# Patient Record
Sex: Male | Born: 1980 | Hispanic: No | Marital: Married | Smoking: Never smoker
Health system: Southern US, Community
[De-identification: ages and names within clinical notes are randomized; demographics above are authoritative.]

## PROBLEM LIST (undated history)

## (undated) DIAGNOSIS — E119 Type 2 diabetes mellitus without complications: Secondary | ICD-10-CM

## (undated) HISTORY — PX: TONSILLECTOMY: SUR1361

---
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
## 2015-09-06 DIAGNOSIS — H52223 Regular astigmatism, bilateral: Secondary | ICD-10-CM | POA: Diagnosis not present

## 2015-09-06 DIAGNOSIS — H5213 Myopia, bilateral: Secondary | ICD-10-CM | POA: Diagnosis not present

## 2015-09-07 DIAGNOSIS — Z79899 Other long term (current) drug therapy: Secondary | ICD-10-CM | POA: Diagnosis not present

## 2015-09-07 DIAGNOSIS — R7301 Impaired fasting glucose: Secondary | ICD-10-CM | POA: Diagnosis not present

## 2015-09-07 MED FILL — METFORMIN HCL ER 500 MG TAB: 500 | 90 days supply | Qty: 180 | Fill #0

## 2015-09-09 DIAGNOSIS — F909 Attention-deficit hyperactivity disorder, unspecified type: Secondary | ICD-10-CM | POA: Diagnosis not present

## 2015-09-10 MED FILL — MODAFINIL 200 MG TABLET: 200 | 90 days supply | Qty: 90 | Fill #0

## 2015-09-17 DIAGNOSIS — E042 Nontoxic multinodular goiter: Secondary | ICD-10-CM | POA: Diagnosis not present

## 2015-09-17 DIAGNOSIS — E8881 Metabolic syndrome: Secondary | ICD-10-CM | POA: Diagnosis not present

## 2015-09-17 DIAGNOSIS — E291 Testicular hypofunction: Secondary | ICD-10-CM | POA: Diagnosis not present

## 2015-10-04 ENCOUNTER — Encounter: Payer: Self-pay | Admitting: Emergency Medicine

## 2015-10-04 ENCOUNTER — Ambulatory Visit
Admission: EM | Admit: 2015-10-04 | Discharge: 2015-10-04 | Disposition: A | Discharge status: Home / Self Care | Payer: 59 | Attending: Family Medicine | Admitting: Family Medicine

## 2015-10-04 DIAGNOSIS — S90852A Superficial foreign body, left foot, initial encounter: Secondary | ICD-10-CM

## 2015-10-04 HISTORY — DX: Type 2 diabetes mellitus without complications: E11.9

## 2015-10-04 MED ORDER — LIDOCAINE HCL (PF) 1 % IJ SOLN: 5.0000 mL | Freq: Once | INTRAMUSCULAR | Status: DC

## 2015-10-04 MED ORDER — CEPHALEXIN 500 MG PO CAPS: 500.0000 mg | ORAL_CAPSULE | Freq: Four times a day (QID) | ORAL | Status: AC

## 2015-10-04 NOTE — ED Notes (Signed)
Patient states that he has a splinter in his left foot since yesterday.  Patient reports some redness and tenderness.

## 2015-10-04 NOTE — ED Provider Notes (Signed)
[facility]  ____________________________________________  Time seen: Approximately 11:48 AM  I have reviewed the triage vital signs and the nursing notes.   HISTORY  Chief Complaint Foreign Body in Skin   HPI Russell Willis is a 35 y.o. male presents with a complaint of splinter and left foot since yesterday. Patient reports that he was walking outside barefoot on his patio and felt something go into his foot. Patient states that he did pull out a small piece of what appeared to be wood, but states he is concern that a another piece is still in his foot. Denies any other pain or injury. Denies head injury or loss of consciousness. Denies fall to the ground. Patient states there is minimal redness directly around puncture site. Denies any drainage. Denies any fevers. Denies any numbness, tingling sensation or pain radiation. Patient reports his tetanus immunization is up-to-date and was within the last 2 years.  Denies any other pain or complaints.  PCP: [facility].    Past Medical History  Diagnosis Date  . Diabetes mellitus without complication (HCC)     There are no active problems to display for this patient.   Past Surgical History  Procedure Laterality Date  . Tonsillectomy      Current Outpatient Rx  Name  Route  Sig  Dispense  Refill  . clomiPHENE (CLOMID) 50 MG tablet   Oral   Take 50 mg by mouth as directed.         . metFORMIN (GLUCOPHAGE) 500 MG tablet   Oral   Take 250 mg by mouth 2 (two) times daily with a meal.         . modafinil (PROVIGIL) 200 MG tablet   Oral   Take 200 mg by mouth daily.         .             Allergies Amoxicillin  History reviewed. No pertinent family history.  Social History Social History  Substance Use Topics  . Smoking status: Never Smoker   . Smokeless tobacco: Never Used  . Alcohol Use: Yes    Review of Systems Constitutional: No fever/chills Eyes: No visual changes. ENT:  No sore throat. Cardiovascular: Denies chest pain. Respiratory: Denies shortness of breath. Gastrointestinal: No abdominal pain.  No nausea, no vomiting.  No diarrhea.  No constipation. Genitourinary: Negative for dysuria. Musculoskeletal: Negative for back pain. Skin: Negative for rash. Neurological: Negative for headaches, focal weakness or numbness.  10-point ROS otherwise negative.  ____________________________________________   PHYSICAL EXAM:  VITAL SIGNS: ED Triage Vitals  Enc Vitals Group     BP 10/04/15 1046 119/75 mmHg     Pulse Rate 10/04/15 1046 80     Resp 10/04/15 1046 16     Temp 10/04/15 1046 98.2 F (36.8 C)     Temp Source 10/04/15 1046 Tympanic     SpO2 10/04/15 1046 98 %     Weight 10/04/15 1046 250 lb (113.399 kg)     Height 10/04/15 1046  (1.727 m)     Head Cir --      Peak Flow --      Pain Score 10/04/15 1051 2     Pain Loc --      Pain Edu? --      Excl. in GC? --     Constitutional: Alert and oriented. Well appearing and in no acute distress. Eyes: Conjunctivae are normal. PERRL. EOMI. Head: Atraumatic.   Nose: No  congestion/rhinnorhea.  Mouth/Throat: Mucous membranes are moist.   Hematological/Lymphatic/Immunilogical: No cervical lymphadenopathy. Cardiovascular: Normal rate, regular rhythm. Grossly normal heart sounds.  Good peripheral circulation. Respiratory: Normal respiratory effort.  No retractions. Lungs CTAB. Musculoskeletal: No lower or upper extremity tenderness nor edema. Bilateral pedal pulses equal and easily palpated. See skin below.  Neurologic:  Normal speech and language. No gross focal neurologic deficits are appreciated. No gait instability. Skin:  Skin is warm, dry and intact. No rash noted. Except : Left distal medial plantar foot small appearance of superficial foreign body present, minimal erythema directly around puncture site, minimal tenderness, no exudate, no drainage, full range of motion, no bony tenderness,  left foot otherwise nontender. Left foot full range of motion. Capillary refill less than 2 seconds to all left foot distal toes.  Psychiatric: Mood and affect are normal. Speech and behavior are normal.  ____________________________________________   LABS (all labs ordered are listed, but only abnormal results are displayed)  Labs Reviewed - No data to display  RADIOLOGY  Patient declines need for.  ____________________________________________   PROCEDURES  Procedure(s) performed:  Procedure(s) performed:  Procedure explained and verbal consent obtained. Consent: Verbal consent obtained. Written consent not obtained. Risks and benefits: risks, benefits and alternatives were discussed Patient identity confirmed: verbally with patient and hospital-assigned identification number  Consent given by: patient   Foreign body removal Location: Left plantar foot Foreign bodies: appearance of small dark colored foreign body Tendon involvement: none Nerve involvement: none Preparation: Patient was prepped and draped in the usual sterile fashion. Anesthesia with 1% Lidocaine 2 mls Irrigation solution: saline and betadine Irrigation method: jet lavage Amount of cleaning: copious Very superficial small incision made with #11 blade scalpel and sterile forceps used to remove small dark colored foreign body with appearance of wound grains. Foreign body <0.25 cm in length. Irrigated post removal. No remaining foreign bodies visualized.  Technique: simple interrupted  Approximation: loose Patient tolerate well.  dressing applied.  Wound care instructions provided.  Observe for any signs of infection or other problems.     ____________________________________________   INITIAL IMPRESSION / ASSESSMENT AND PLAN / ED COURSE  Pertinent labs & imaging results that were available during my care of the patient were reviewed by me and considered in my medical decision making (see chart for  details).  Left plantar surface foreign body which he states he stepped on the splinter yesterday. Patient reports he did pull part of it out. Small superficial remaining foreign body visualized. Foreign body body removed. Patient tolerated well. No appearance of retained foreign body. Patient reports he is a prediabetic and currently on metformin. Counseled regarding wound monitoring and cleaning. Encourage topical antibiotic over-the-counter. Will prescribe cephalexin for 5 days. Encourage monitoring and elevation of left foot.   Discussed follow up with Primary care physician this week. Discussed follow up and return parameters including no resolution or any worsening concerns. Patient verbalized understanding and agreed to plan.   ____________________________________________   FINAL CLINICAL IMPRESSION(S) / ED DIAGNOSES  Final diagnoses:  Splinter of left foot, initial encounter      Note: This dictation was prepared with Dragon dictation along with smaller phrase technology. Any transcriptional errors that result from this process are unintentional.    Renford DillsLindsey Elyce Zollinger, NP 10/04/15 1157

## 2015-10-04 NOTE — Discharge Instructions (Signed)
Take medication as prescribed. Keep clean with soap and water, apply topical antibiotic ointment daily as discussed.    Follow up with your primary care physician this week as needed. Return to Urgent care for new or worsening concerns.    Sliver Removal, Care After A sliver--also called a splinter--is a small and thin broken piece of an object that gets stuck (embedded) under the skin. A sliver can create a deep wound that can easily become infected. It is important to care for the wound after a sliver is removed to help prevent infection and other problems from developing. WHAT TO EXPECT AFTER THE PROCEDURE Slivers often break into smaller pieces when they are removed. If pieces of your sliver broke off and stayed in your skin, you will eventually see them working themselves out and you may feel some pain at the wound site. This is normal. HOME CARE INSTRUCTIONS  Keep all follow-up visits as directed by your health care provider. This is important.  There are many different ways to close and cover a wound, including stitches (sutures) and adhesive strips. Follow your health care provider's instructions about:  Wound care.  Bandage (dressing) changes and removal.  Wound closure removal.  Check the wound site every day for signs of infection. Watch for:  Red streaks coming from the wound.  Fever.  Redness or tenderness around the wound.  Fluid, blood, or pus coming from the wound.  A bad smell coming from the wound. SEEK MEDICAL CARE IF:  You think that a piece of the sliver is still in your skin.  Your wound was closed, as with sutures, and the edges of the wound break open.  You have signs of infection, including:  New or worsening redness around the wound.  New or worsening tenderness around the wound.  Fluid, blood, or pus coming from the wound.  A bad smell coming from the wound or dressing. SEEK IMMEDIATE MEDICAL CARE IF: You have any of the following signs of  infection:  Red streaks coming from the wound.  An unexplained fever.   This information is not intended to replace advice given to you by your health care provider. Make sure you discuss any questions you have with your health care provider.   Document Released: 06/02/2000 Document Revised: 06/26/2014 Document Reviewed: 02/05/2014 Elsevier Interactive Patient Education [employer]

## 2015-10-06 ENCOUNTER — Ambulatory Visit: Payer: Self-pay | Admitting: Cardiology

## 2015-10-20 MED FILL — CLOMIPHENE CITRATE 50 MG TA: 50 | 28 days supply | Qty: 12 | Fill #0

## 2015-10-21 DIAGNOSIS — E042 Nontoxic multinodular goiter: Secondary | ICD-10-CM | POA: Diagnosis not present

## 2015-10-21 DIAGNOSIS — Z6839 Body mass index (BMI) 39.0-39.9, adult: Secondary | ICD-10-CM | POA: Diagnosis not present

## 2015-10-21 DIAGNOSIS — E8881 Metabolic syndrome: Secondary | ICD-10-CM | POA: Diagnosis not present

## 2015-10-21 DIAGNOSIS — E291 Testicular hypofunction: Secondary | ICD-10-CM | POA: Diagnosis not present

## 2015-11-08 DIAGNOSIS — F909 Attention-deficit hyperactivity disorder, unspecified type: Secondary | ICD-10-CM | POA: Diagnosis not present

## 2015-11-08 MED FILL — QSYMIA 3.75 MG-23 MG CAP: 3.75-23 | 15 days supply | Qty: 15 | Fill #0

## 2015-11-19 MED FILL — CLOMIPHENE CITRATE 50 MG TA: 50 | 28 days supply | Qty: 12 | Fill #1 | Status: TO

## 2015-11-25 DIAGNOSIS — E291 Testicular hypofunction: Secondary | ICD-10-CM | POA: Diagnosis not present

## 2015-11-25 DIAGNOSIS — E8881 Metabolic syndrome: Secondary | ICD-10-CM | POA: Diagnosis not present

## 2015-11-25 DIAGNOSIS — E663 Overweight: Secondary | ICD-10-CM | POA: Diagnosis not present

## 2015-11-25 DIAGNOSIS — E042 Nontoxic multinodular goiter: Secondary | ICD-10-CM | POA: Diagnosis not present

## 2015-11-26 MED FILL — QSYMIA 7.5 MG-46 MG CAPSULE: 7.5-46 | 30 days supply | Qty: 30 | Fill #0 | Status: TO

## 2015-12-13 ENCOUNTER — Encounter: Payer: 59 | Attending: "Endocrinology | Admitting: Dietician

## 2015-12-13 ENCOUNTER — Encounter: Payer: Self-pay | Admitting: Dietician

## 2015-12-13 VITALS — Ht 67.0 in | Wt 252.5 lb

## 2015-12-13 DIAGNOSIS — Z713 Dietary counseling and surveillance: Secondary | ICD-10-CM | POA: Insufficient documentation

## 2015-12-13 DIAGNOSIS — R7303 Prediabetes: Secondary | ICD-10-CM

## 2015-12-13 NOTE — Progress Notes (Signed)
Notes from [employer] employee "self referral" nutrition session: Start time: 1030   End time: 1130  Met with employee to discuss his/her nutritional concerns and diet history. The employee's questions/concerns were also addressed.  Diet history: Breakfast: 3 eggs or peanut butter toast Lunch: depends on time. Often salads, loves Mexican: diced chicken or beef, avocado, tomatoes. Sometimes cauliflower rice or quinoa Snack: protein bar, beef jerky. Fruit--blueberries Supper: pot roast, pork tenderloin with carrots, celery, fruit (plums) with chopped cauliflower. Somtimes sandwich.  Beverages: water, sometimes [place], coffee in afternoon  Concerns: patient reports diagnosis of pre-diabetes; he is taking metformin and states his HbA1C is 5%. He has made significant diet changes to reduce sugar and processed carbohydrates, following low-glycemic diet.    He also reports being lactose intolerant. He states he has lost about 15lbs since making changes. He reports challenge in finding a variety of healthy lunch options.   We discussed the following topics:  Healthy Eating  Exercise  Diabetes prevention, including glycemic index and importance of controlling carb amounts as well as choices.   Weight Concerns  I also provided the following handouts as reinforcement of the educational session:  General dietary guidelines for diabetes  Sample menus and/or recipes: Quick and Healthy Meal Ideas, Lunch On-the-Go   Additional Comments: Commended patient for changes he has made.  Encouraged ongoing effort to increase exercise.    Goals Agreed Upon: 1. Continue with current healthy eating pattern  2. Continue to work to incorporate a regular pattern of exercise.   3. Use menus, idea of mason jar make-ahead lunches to add variety to meal choices.     

## 2015-12-15 MED FILL — METFORMIN HCL ER 500 MG TAB: 500 | 90 days supply | Qty: 180 | Fill #1

## 2015-12-20 MED FILL — MODAFINIL 200 MG TABLET: 200 | 90 days supply | Qty: 90 | Fill #0

## 2015-12-24 DIAGNOSIS — E291 Testicular hypofunction: Secondary | ICD-10-CM | POA: Diagnosis not present

## 2015-12-24 DIAGNOSIS — E663 Overweight: Secondary | ICD-10-CM | POA: Diagnosis not present

## 2016-02-02 MED FILL — QSYMIA 7.5 MG-46 MG CAPSULE: 7.5-46 | 30 days supply | Qty: 30 | Fill #0 | Status: TO

## 2016-03-06 DIAGNOSIS — R7301 Impaired fasting glucose: Secondary | ICD-10-CM | POA: Diagnosis not present

## 2016-03-06 DIAGNOSIS — Z0000 Encounter for general adult medical examination without abnormal findings: Secondary | ICD-10-CM | POA: Diagnosis not present

## 2016-03-24 DIAGNOSIS — Z6839 Body mass index (BMI) 39.0-39.9, adult: Secondary | ICD-10-CM | POA: Diagnosis not present

## 2016-03-24 DIAGNOSIS — E291 Testicular hypofunction: Secondary | ICD-10-CM | POA: Diagnosis not present

## 2016-03-24 DIAGNOSIS — E663 Overweight: Secondary | ICD-10-CM | POA: Diagnosis not present

## 2016-03-28 DIAGNOSIS — E042 Nontoxic multinodular goiter: Secondary | ICD-10-CM | POA: Diagnosis not present

## 2016-03-28 DIAGNOSIS — R7989 Other specified abnormal findings of blood chemistry: Secondary | ICD-10-CM | POA: Diagnosis not present

## 2016-03-28 DIAGNOSIS — E291 Testicular hypofunction: Secondary | ICD-10-CM | POA: Diagnosis not present

## 2016-03-28 DIAGNOSIS — E8881 Metabolic syndrome: Secondary | ICD-10-CM | POA: Diagnosis not present

## 2016-03-31 MED FILL — QSYMIA 11.25 MG-69 MG CAP: 11.25-69 | 30 days supply | Qty: 30 | Fill #0 | Status: TO

## 2016-04-18 DIAGNOSIS — F4322 Adjustment disorder with anxiety: Secondary | ICD-10-CM | POA: Diagnosis not present

## 2016-04-18 DIAGNOSIS — F909 Attention-deficit hyperactivity disorder, unspecified type: Secondary | ICD-10-CM | POA: Diagnosis not present

## 2016-04-26 DIAGNOSIS — Z6836 Body mass index (BMI) 36.0-36.9, adult: Secondary | ICD-10-CM | POA: Diagnosis not present

## 2016-04-26 DIAGNOSIS — E291 Testicular hypofunction: Secondary | ICD-10-CM | POA: Diagnosis not present

## 2016-04-26 DIAGNOSIS — E669 Obesity, unspecified: Secondary | ICD-10-CM | POA: Diagnosis not present

## 2016-04-26 DIAGNOSIS — R7303 Prediabetes: Secondary | ICD-10-CM | POA: Diagnosis not present

## 2016-04-26 DIAGNOSIS — E042 Nontoxic multinodular goiter: Secondary | ICD-10-CM | POA: Diagnosis not present

## 2016-04-26 DIAGNOSIS — E8881 Metabolic syndrome: Secondary | ICD-10-CM | POA: Diagnosis not present

## 2016-04-26 DIAGNOSIS — N182 Chronic kidney disease, stage 2 (mild): Secondary | ICD-10-CM | POA: Diagnosis not present

## 2016-04-26 DIAGNOSIS — R7989 Other specified abnormal findings of blood chemistry: Secondary | ICD-10-CM | POA: Diagnosis not present

## 2016-05-15 ENCOUNTER — Other Ambulatory Visit: Payer: Self-pay | Admitting: Family Medicine

## 2016-05-15 ENCOUNTER — Ambulatory Visit
Admission: RE | Admit: 2016-05-15 | Discharge: 2016-05-15 | Disposition: A | Discharge status: Home / Self Care | Payer: 59 | Source: Ambulatory Visit | Attending: Family Medicine | Admitting: Family Medicine

## 2016-05-15 DIAGNOSIS — E119 Type 2 diabetes mellitus without complications: Secondary | ICD-10-CM | POA: Diagnosis not present

## 2016-05-15 DIAGNOSIS — M79605 Pain in left leg: Secondary | ICD-10-CM

## 2016-05-15 DIAGNOSIS — E7800 Pure hypercholesterolemia, unspecified: Secondary | ICD-10-CM | POA: Diagnosis not present

## 2016-05-25 DIAGNOSIS — E042 Nontoxic multinodular goiter: Secondary | ICD-10-CM | POA: Diagnosis not present

## 2016-05-25 DIAGNOSIS — N182 Chronic kidney disease, stage 2 (mild): Secondary | ICD-10-CM | POA: Diagnosis not present

## 2016-05-25 DIAGNOSIS — E781 Pure hyperglyceridemia: Secondary | ICD-10-CM | POA: Diagnosis not present

## 2016-08-09 DIAGNOSIS — F909 Attention-deficit hyperactivity disorder, unspecified type: Secondary | ICD-10-CM | POA: Diagnosis not present

## 2016-08-30 DIAGNOSIS — N4829 Other inflammatory disorders of penis: Secondary | ICD-10-CM | POA: Diagnosis not present

## 2016-08-30 DIAGNOSIS — E8881 Metabolic syndrome: Secondary | ICD-10-CM | POA: Diagnosis not present

## 2016-08-30 DIAGNOSIS — E042 Nontoxic multinodular goiter: Secondary | ICD-10-CM | POA: Diagnosis not present

## 2016-08-30 DIAGNOSIS — E291 Testicular hypofunction: Secondary | ICD-10-CM | POA: Diagnosis not present

## 2016-08-30 DIAGNOSIS — N182 Chronic kidney disease, stage 2 (mild): Secondary | ICD-10-CM | POA: Diagnosis not present

## 2016-10-26 DIAGNOSIS — H52223 Regular astigmatism, bilateral: Secondary | ICD-10-CM | POA: Diagnosis not present

## 2016-10-26 DIAGNOSIS — H5213 Myopia, bilateral: Secondary | ICD-10-CM | POA: Diagnosis not present

## 2017-03-06 IMAGING — US US EXTREM LOW VENOUS*L*
1 series · 14 of 24 positions shown · non-contrast
Comparison: None

CLINICAL DATA: Left leg pain x3 days

EXAM:
LEFT LOWER EXTREMITY VENOUS DOPPLER ULTRASOUND
TECHNIQUE: Gray-scale sonography with compression, as well as color and duplex
ultrasound, were performed to evaluate the deep venous system from
the level of the common femoral vein through the popliteal and
proximal calf veins.

[Series 1: us extrem low venous*left* · 0.08mm/px · 14 of 38 slices shown]
[im 1/38]
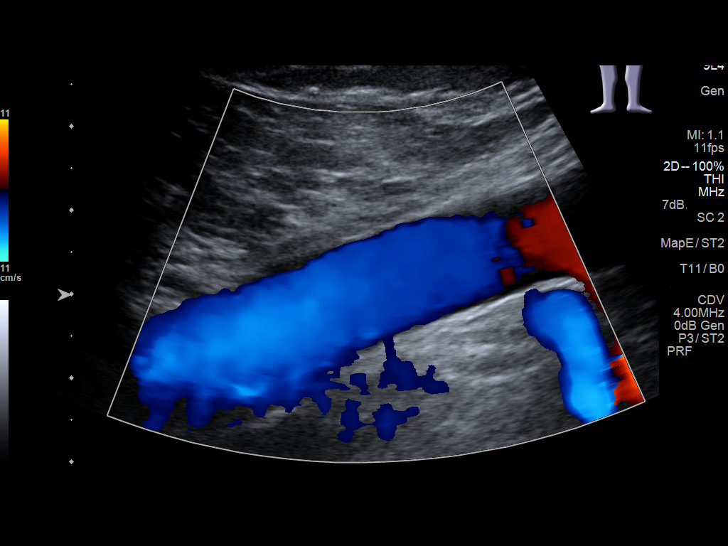
[im 4/38]
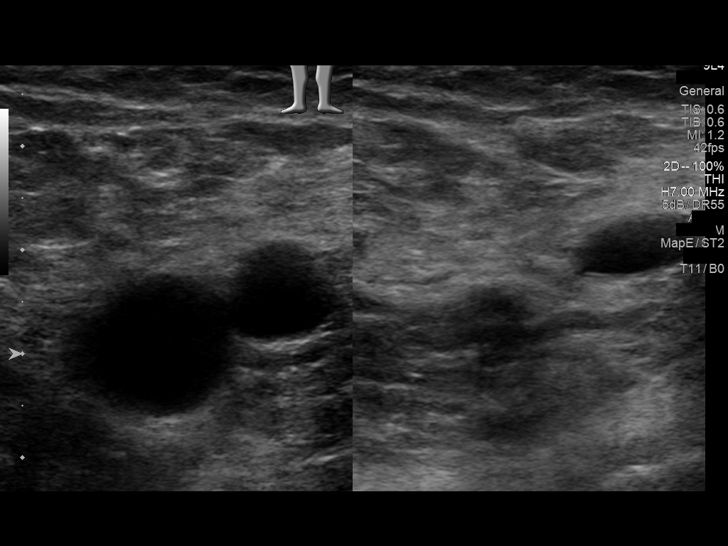
[im 7/38]
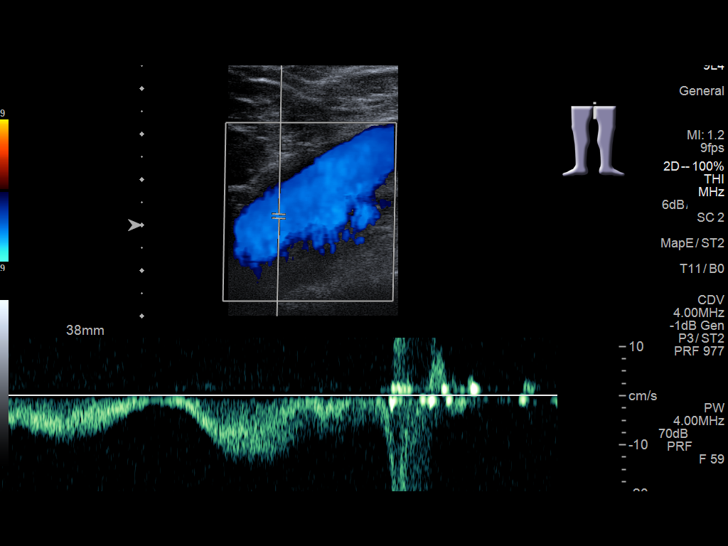
[im 10/38]
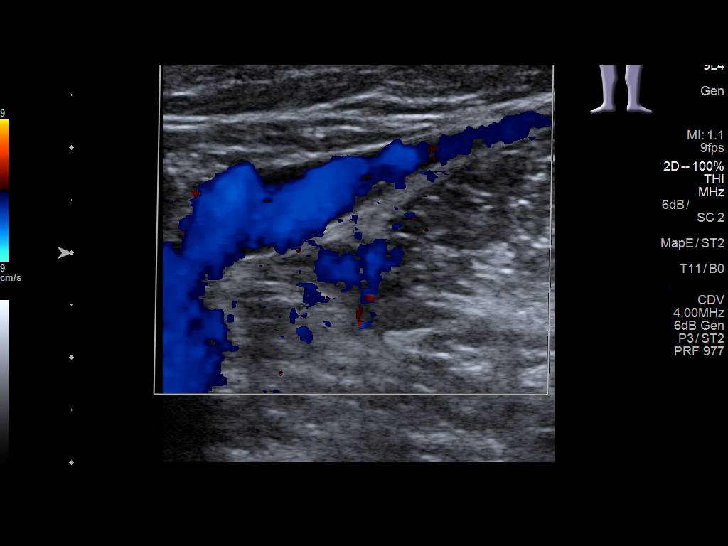
[im 12/38]
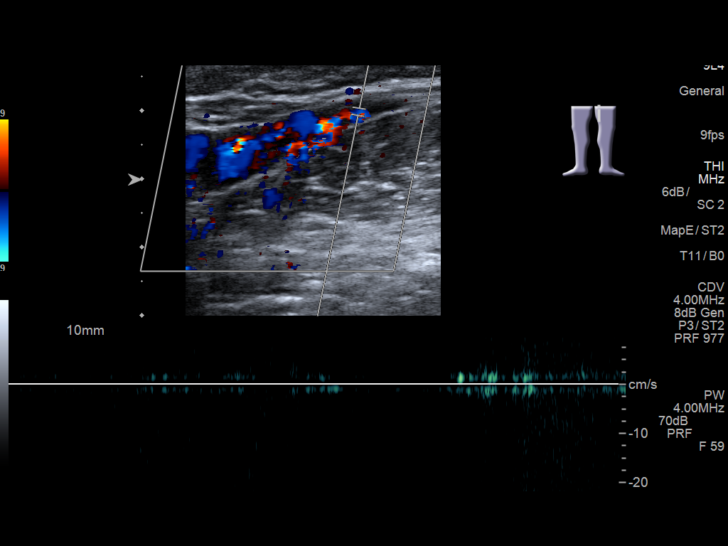
[im 15/38]
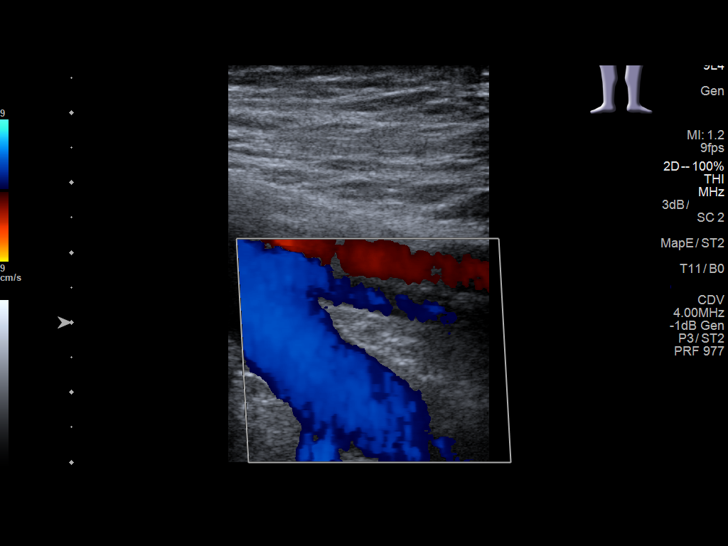
[im 18/38]
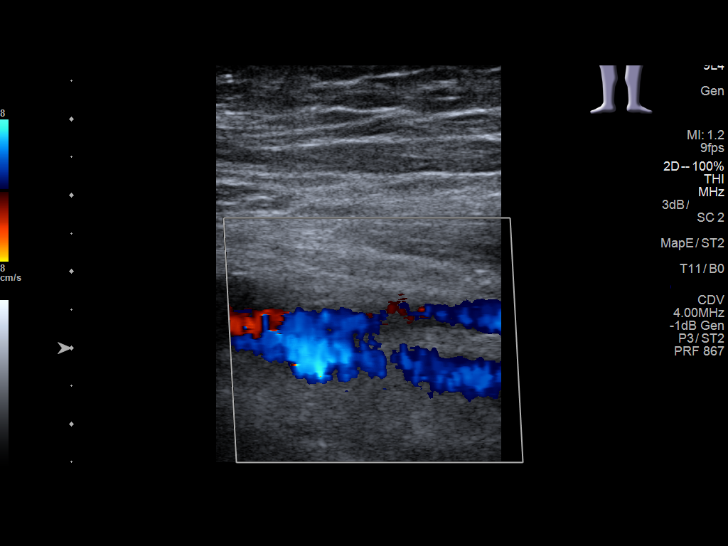
[im 20/38]
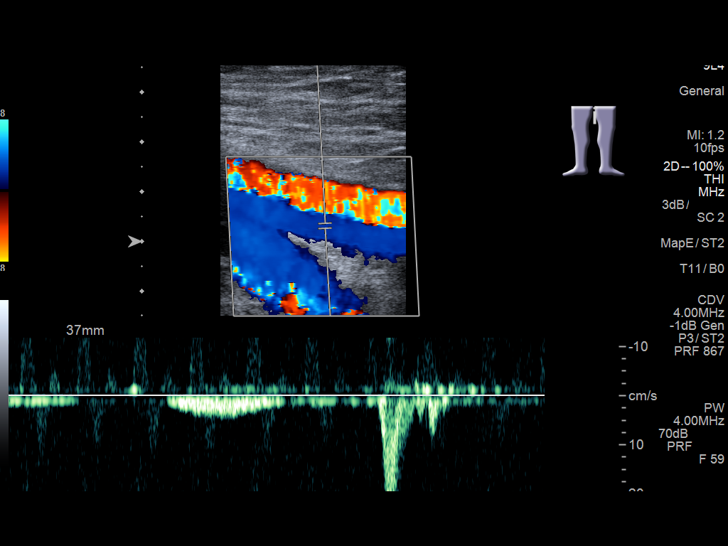
[im 23/38]
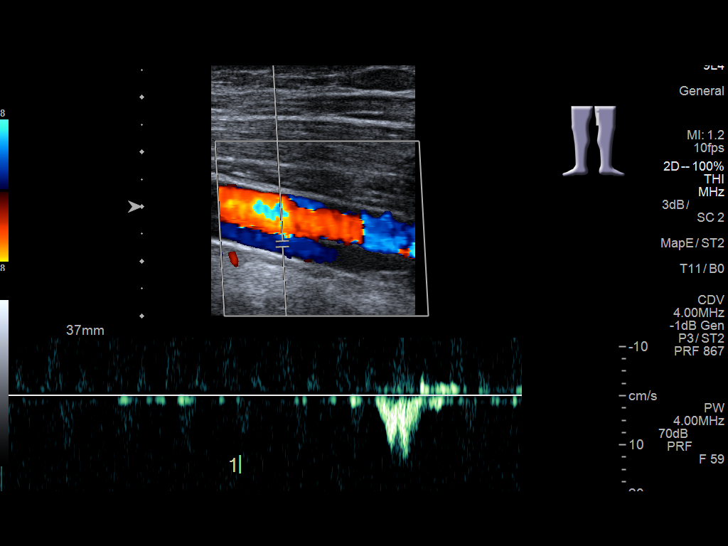
[im 26/38]
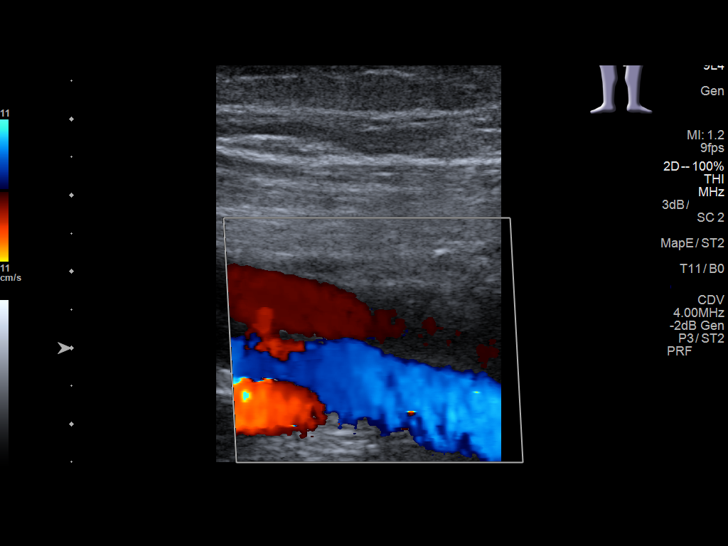
[im 29/38]
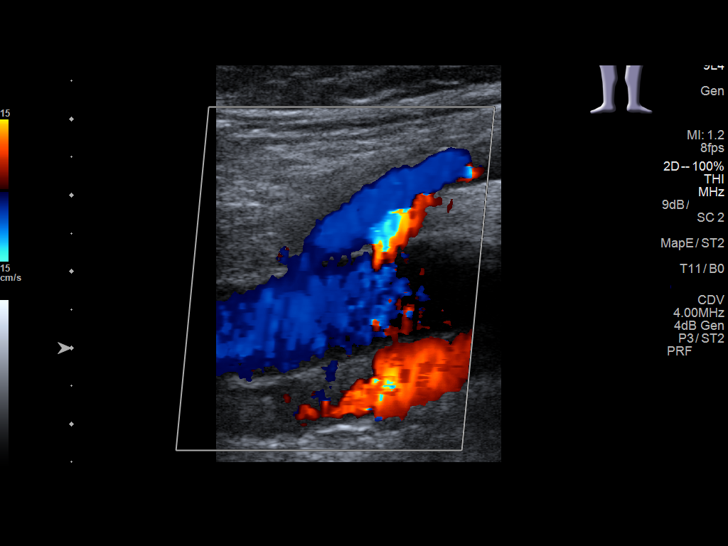
[im 31/38]
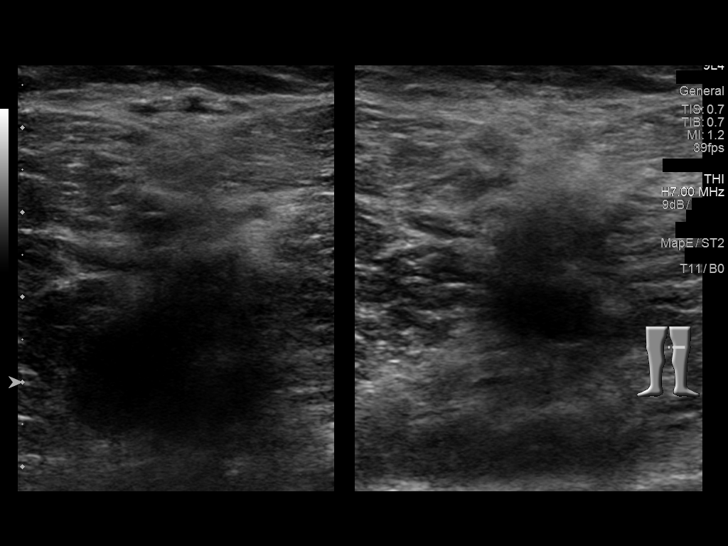
[im 34/38]
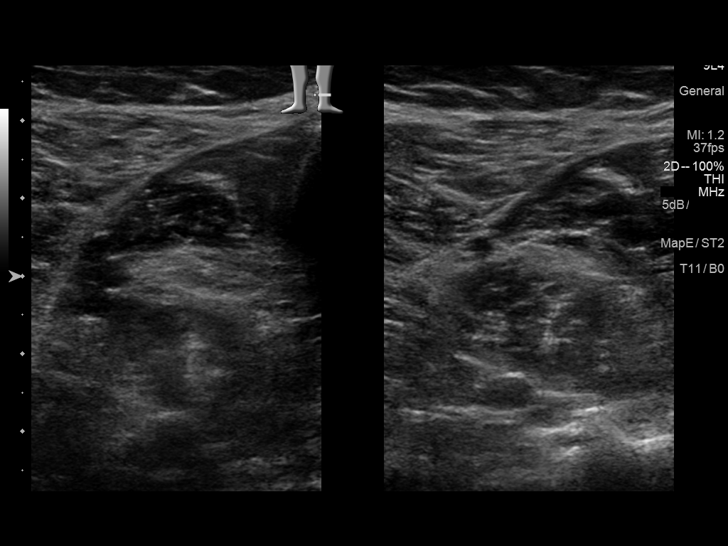
[im 38/38]
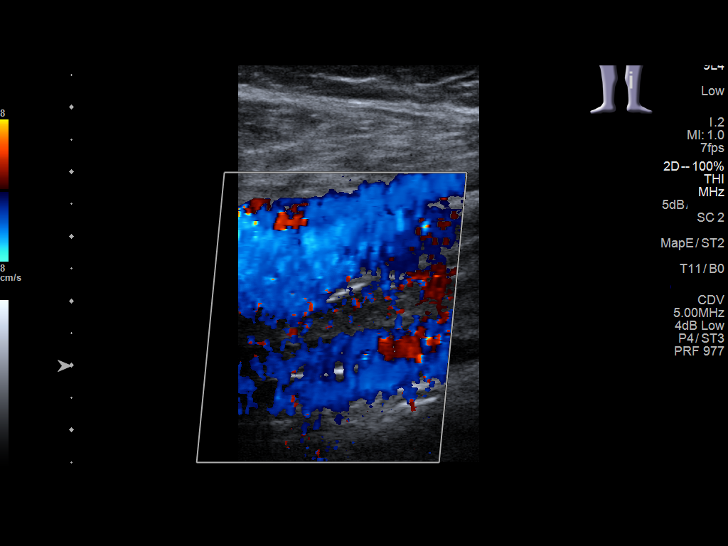

[14 of 24 positions shown; findings below may reference images not displayed]

FINDINGS: Normal compressibility of the common femoral, superficial femoral,
and popliteal veins, as well as the proximal calf veins. No filling
defects to suggest DVT on grayscale or color Doppler imaging.
Doppler waveforms show normal direction of venous flow, normal
respiratory phasicity and response to augmentation. Visualized
segments of the saphenous venous system normal in caliber and
compressibility. Survey views of the contralateral common femoral
vein are unremarkable.
IMPRESSION: No evidence of  lower extremity deep vein thrombosis, left.

## 2024-02-06 ENCOUNTER — Other Ambulatory Visit: Payer: Self-pay

## 2024-02-06 ENCOUNTER — Emergency Department: Admission: EM | Admit: 2024-02-06 | Source: Home / Self Care
